# Patient Record
Sex: Male | Born: 1970 | Race: Black or African American | Hispanic: No | Smoking: Never smoker
Health system: Southern US, Community
[De-identification: ages and names within clinical notes are randomized; demographics above are authoritative.]

## PROBLEM LIST (undated history)

## (undated) DIAGNOSIS — M5430 Sciatica, unspecified side: Secondary | ICD-10-CM

## (undated) DIAGNOSIS — M069 Rheumatoid arthritis, unspecified: Secondary | ICD-10-CM

## (undated) HISTORY — PX: APPENDECTOMY: SHX54

---

## 2008-04-18 ENCOUNTER — Encounter (INDEPENDENT_AMBULATORY_CARE_PROVIDER_SITE_OTHER): Payer: Self-pay | Admitting: General Surgery

## 2008-04-18 ENCOUNTER — Inpatient Hospital Stay (HOSPITAL_COMMUNITY): Admission: EM | Admit: 2008-04-18 | Discharge: 2008-04-20 | Payer: Self-pay | Admitting: Family Medicine

## 2009-06-13 ENCOUNTER — Emergency Department (HOSPITAL_COMMUNITY): Admission: EM | Admit: 2009-06-13 | Discharge: 2009-06-13 | Payer: Self-pay | Admitting: Emergency Medicine

## 2009-06-14 ENCOUNTER — Emergency Department (HOSPITAL_COMMUNITY): Admission: EM | Admit: 2009-06-14 | Discharge: 2009-06-14 | Payer: Self-pay | Admitting: Emergency Medicine

## 2011-01-31 NOTE — H&P (Signed)
[name], [name]             ACCOUNT NO.:  [id number]   MEDICAL RECORD NO.:  [id number]          PATIENT TYPE:  EMS   LOCATION:  MAJO                         FACILITY:  [facility]   PHYSICIAN:  Gabrielle Dare. Janee Morn, M.D.DATE OF BIRTH:  1971/07/28   DATE OF ADMISSION:  04/17/2008  DATE OF DISCHARGE:                              HISTORY & PHYSICAL   CHIEF COMPLAINT:  Right lower quadrant and abdominal pain.   HISTORY OF PRESENT ILLNESS:  [name] is a 40 year old African-  American gentleman who was driving from [place] to [place] and he  developed right lower quadrant abdominal pain about 4:30 this morning.  He pulled up the road.  He had significant nausea and vomiting followed  by dry heaves.  The pain continued and he called his parents, they went  and picked him up.  They brought him to [facility].  He was  evaluated there and there was concern for possible appendicitis.  He was  transferred to the [facility] Emergency Department and underwent further  evaluation including CT scan of the abdomen and pelvis, which is  consistent with acute appendicitis with no evidence of perforation or  abscess.  We were asked to evaluate for further treatment.  He continues  to complain of some pain in the right lower quadrant, though it is  better since he received some pain medication recently and that was 1 mg  of Dilaudid.   PAST MEDICAL HISTORY:  Rheumatoid arthritis was recently diagnosed.  He  was prescribed some antiinflammatories, but he has not started taking  them yet.   SOCIAL HISTORY:  He does not smoke.  He does not drink.  He works in a  [employer].   ALLERGIES:  Codeine.  He had severe reaction to that in the past  requiring hospitalization.   PAST SURGICAL HISTORY:  Right wrist surgery from what he describes as a  trigger thumb.   CURRENT MEDICATIONS:  None.   REVIEW OF SYSTEMS:  GI:  As described above.  Otherwise negative.   PHYSICAL EXAMINATION:   VITALS:  Temperature 100.1, pulse 96,  respirations 18, and blood pressure 127/71.  GENERAL:  He is awake and alert.  He is in no distress.  He appears  stated age.  HEENT:  Normocephalic and atraumatic.  No fascial abnormality is noted.  NECK:  Supple with no tenderness.  LYMPH EXAM:  Reveals no supraclavicular, cervical, or periumbilical  lymphadenopathy.  CHEST:  Clear to auscultation.  Respiratory effort is good.  HEART:  Regular, no murmurs heard.  Distal pulses are 2+ with no  peripheral edema.  ABDOMEN:  Tenderness from right lower quadrant.  There is no guarding or  masses felt.  He has hypoactive bowel sounds and no organomegaly is  palpated.  EXTREMITIES:  Warm with no edema.  SKIN:  Dry with no rashes.  NEUROLOGIC:  Intact with no focal deficits.   CT scan of the abdomen and pelvis shows acute appendicitis with no  evidence of perforation.  Sodium 139, potassium 4, chloride 106, CO2 24,  BUN 7, creatinine 1.21, glucose 118,  white blood cell count 14.2, and  hemoglobin 15.7.   IMPRESSION:  Acute appendicitis.   PLAN:  We will give him IV antibiotics and take him to the operating  room for laparoscopic appendectomy.  Procedure, risks, and benefits were  discussed in detail with the patient and his mother and they agreed.  His mother signed this consent as he had received intravenous narcotics.  We will plan to do his appendectomy emergently tonight.      Gabrielle Dare Janee Morn, M.D.  Electronically Signed     BET/MEDQ  D:  04/17/2008  T:  04/18/2008  Job:  [id number]

## 2011-01-31 NOTE — Op Note (Signed)
NAMEANTHEM, FRAZER             ACCOUNT NO.:  [id number]   MEDICAL RECORD NO.:  [id number]          PATIENT TYPE:  INP   LOCATION:  5153                         FACILITY:  [facility]   PHYSICIAN:  Gabrielle Dare. Janee Morn, M.D.DATE OF BIRTH:  [birth year]   DATE OF PROCEDURE:  04/18/2008  DATE OF DISCHARGE:                               OPERATIVE REPORT   PREOPERATIVE DIAGNOSIS:  Acute appendicitis.   POSTOPERATIVE DIAGNOSIS:  Acute appendicitis.   PROCEDURE:  Laparoscopic appendectomy.   SURGEON:  Gabrielle Dare. Janee Morn, MD   ANESTHESIA:  General.   HISTORY OF PRESENT ILLNESS:  Mr. Marrow is a 40 year old African  American gentleman who initially went to [facility] with  abdominal pain, nausea, and vomiting who was referred to [facility]  Emergency Department and further evaluation included CT scan of the  abdomen and pelvis demonstrating acute appendicitis.  He is brought to  the operating room for emergency appendectomy.   PROCEDURE IN DETAIL:  Informed consent was obtained.  The patient was  identified in the preop holding area.  He received intravenous  antibiotics.  He was brought to the operating room.  General anesthesia  was administered.  Foley was inserted by nursing staff.  His abdomen was  prepped and draped in a sterile fashion.  Infraumbilical region was  infiltrated with 0.25% Marcaine.  Infraumbilical incision was made.  Subcutaneous tissues were dissected down revealing the anterior fascia.  This was divided sharply.  Peritoneal cavity was then entered under  direct vision without difficulty.  A 0 Vicryl purse-string suture was  placed around the fascial opening.  The Hasson trocar was inserted into  the abdomen.  The abdomen was insufflated with carbon dioxide in a  standard fashion under direct vision.  A 12-mm left lower quadrant and a  5-mm right upper quadrant ports were placed.  Marcaine 0.25% with  epinephrine was used at all port sites.  Laparoscopic  exploration  revealed acutely inflamed, but not perforated appendix.  The appendix  was retracted anteriorly.  The mesoappendix was then gradually divided  with the harmonic scalpel achieving excellent hemostasis.  The base was  intact without inflammation.  The base of the appendix was then divided  with endoscopic GIA stapler with a vascular load.  This achieved an  excellent closure.  Appendix was placed in an Endocatch bag and removed  from the abdomen via the left lower quadrant port site.  The abdomen was  copiously irrigated with over a liter of warm saline.  Irrigation fluid  returned clear.  The mesoappendix remained completely dry and the staple  line remained intact.  Irrigation fluid was evacuated and it was clear.  Ports were removed under direct vision.  Pneumoperitoneum was released.  The Hasson trocar was removed.  The infraumbilical fascia was closed by  tying the 0 Vicryl purse-string suture with care not to trap any intra-  abdominal  contents.  All 3 wounds were copiously irrigated.  The skin of each was  then closed with running 4-0 Vicryl subcuticular stitch followed by  Dermabond.  Sponge, needle, and instrument counts were  all correct.  The  patient tolerated the procedure well without apparent complication and  was taken to the recovery room in stable condition.      Gabrielle Dare Janee Morn, M.D.  Electronically Signed     BET/MEDQ  D:  04/18/2008  T:  04/18/2008  Job:  [id number]

## 2011-01-31 NOTE — Discharge Summary (Signed)
NAMESANDFORD, DIOP             ACCOUNT NO.:  [id number]   MEDICAL RECORD NO.:  [id number]          PATIENT TYPE:  INP   LOCATION:  5153                         FACILITY:  [facility]   PHYSICIAN:  Wilmon Arms. Corliss Skains, M.D. DATE OF BIRTH:  April 19, 1971   DATE OF ADMISSION:  04/17/2008  DATE OF DISCHARGE:  04/20/2008                               DISCHARGE SUMMARY   DISCHARGING PHYSICIAN:  Wilmon Arms. Corliss Skains, M.D.   CHIEF COMPLAINT/REASON FOR ADMISSION:  Mr. Shipley is a 40 year old  male patient who was relocating to [place], [place], from [place], [place].  He was actually driving down here when he developed  right lower quadrant abdominal pain with nausea and vomiting.  His  parents brought him to the [facility] and he was  subsequently transferred to [facility] Emergency Room after a CT of the  abdomen and pelvis revealed acute appendicitis.   On initial evaluation, the patient had a low grade temperature of 100.1,  otherwise vital signs were stable.  On exam, he was tender in the right  lower quadrant without guarding or masses and hypoactive bowel sounds.  His white count was 14,200, hemoglobin was 15.7, lipase 14.  CT was  reviewed per Dr. Janee Morn and based on clinical findings and CT, it was  determined that the patient required hospital admission and urgent  operative intervention.   ADMITTING DIAGNOSIS:  Acute appendicitis.   HOSPITAL COURSE:  The patient was admitted taken directly from the ER to  the OR in the early morning hours of April 18, 2008, where he underwent  a laparoscopic appendectomy for an acute nonperforated appendicitis by  Dr. Violeta Gelinas.  He was subsequently sent back to the general floor  to recover.   On postop day #1, the patient was better tolerating p.o. diet but did  have a temperature up to 102.  His abdomen was soft.  Incisions were  clean and dry.  Bowel sounds were present.  He was continued briefly on  Avelox for  the next 24 hours and CBC was checked the following morning.   On postop day #2, the patient was afebrile, vital signs were stable.  His white count had decreased to 8000, hemoglobin was 13.4.  He was  ambulating, tolerated diet and oral pain medicines without nausea or  vomiting.  His abdomen was soft, mildly tender over the incision, but  otherwise unremarkable.  The patient was otherwise deemed appropriate  for discharge home.   FINAL DISCHARGE DIAGNOSES:  1. Acute appendicitis.  2. Status post laparoscopic appendectomy on April 18, 2008, by Dr.      Violeta Gelinas, nonperforated appendix.   DISCHARGE MEDICATIONS:  The patient was not taking any medications prior  to admission.  These are his new medications.  1. Percocet 5/325 one or two tablets every 4 hours as need for pain.  2. Over-the-counter ibuprofen 2-3 tablets every 8 hours as needed for      pain.  Always take with food.  May take in addition to Percocet.   ACTIVITY:  The patient is to be out  of work for 2 weeks.  No lifting  greater than 10 pounds.  Otherwise it is okay to shower and slowly  resume usual activities.   DIET:  No changes in preadmission diet.   WOUND CARE:  The patient has Dermabond.  Therefore, no wound care  indicated.   ADDITIONAL INSTRUCTIONS:  The patient is to call the doctor if he  develops redness, drainage or swelling in his puncture wounds,  temperature greater than 101 degrees Fahrenheit, severe pain not  relieved by nonaspirin products or prescribed pain medications.   FOLLOW UP:  The patient will eventually be returning back to [place]  before moving back to [place].  In the interim, he plans on  staying in [place] for a little over a week, so he has been  instructed to call Dr. Janee Morn to be seen in 7-10 days.  He will also  be given a copy of this priority discharge summary to take with him back  to his physicians in [place] and to have available if he develops  any  issues while traveling.      Allison L. Rondel Jumbo. Tsuei, M.D.  Electronically Signed    ALE/MEDQ  D:  04/20/2008  T:  04/20/2008  Job:  [id number]   cc:   Gabrielle Dare. Janee Morn, M.D.

## 2011-04-14 ENCOUNTER — Emergency Department (HOSPITAL_COMMUNITY)
Admission: EM | Admit: 2011-04-14 | Discharge: 2011-04-15 | Disposition: A | Discharge status: Home / Self Care | Payer: Self-pay | Attending: Emergency Medicine | Admitting: Emergency Medicine

## 2011-04-14 DIAGNOSIS — K089 Disorder of teeth and supporting structures, unspecified: Secondary | ICD-10-CM | POA: Insufficient documentation

## 2011-04-14 DIAGNOSIS — K047 Periapical abscess without sinus: Secondary | ICD-10-CM | POA: Insufficient documentation

## 2011-04-16 ENCOUNTER — Emergency Department (HOSPITAL_COMMUNITY): Payer: Medicaid Other

## 2011-04-16 ENCOUNTER — Observation Stay (HOSPITAL_COMMUNITY)
Admission: EM | Admit: 2011-04-16 | Discharge: 2011-04-17 | DRG: 918 | Disposition: A | Discharge status: Home / Self Care | Payer: Medicaid Other | Attending: Family Medicine | Admitting: Family Medicine

## 2011-04-16 DIAGNOSIS — T391X1A Poisoning by 4-Aminophenol derivatives, accidental (unintentional), initial encounter: Principal | ICD-10-CM | POA: Insufficient documentation

## 2011-04-16 DIAGNOSIS — Y92009 Unspecified place in unspecified non-institutional (private) residence as the place of occurrence of the external cause: Secondary | ICD-10-CM | POA: Insufficient documentation

## 2011-04-16 DIAGNOSIS — Z23 Encounter for immunization: Secondary | ICD-10-CM | POA: Insufficient documentation

## 2011-04-16 DIAGNOSIS — K029 Dental caries, unspecified: Secondary | ICD-10-CM | POA: Insufficient documentation

## 2011-04-16 DIAGNOSIS — M069 Rheumatoid arthritis, unspecified: Secondary | ICD-10-CM | POA: Insufficient documentation

## 2011-04-16 DIAGNOSIS — D72829 Elevated white blood cell count, unspecified: Secondary | ICD-10-CM | POA: Insufficient documentation

## 2011-04-16 LAB — CBC
HCT: 46.1 % (ref 39.0–52.0)
Hemoglobin: 16.1 g/dL (ref 13.0–17.0)
MCHC: 34.9 g/dL (ref 30.0–36.0)
WBC: 12.4 10*3/uL — ABNORMAL HIGH (ref 4.0–10.5)

## 2011-04-16 LAB — DIFFERENTIAL
Basophils Absolute: 0 10*3/uL (ref 0.0–0.1)
Lymphocytes Relative: 18 % (ref 12–46)
Monocytes Absolute: 0.9 10*3/uL (ref 0.1–1.0)
Neutro Abs: 9.2 10*3/uL — ABNORMAL HIGH (ref 1.7–7.7)

## 2011-04-16 LAB — COMPREHENSIVE METABOLIC PANEL
ALT: 61 U/L — ABNORMAL HIGH (ref 0–53)
AST: 26 U/L (ref 0–37)
Albumin: 4.4 g/dL (ref 3.5–5.2)
Alkaline Phosphatase: 53 U/L (ref 39–117)
Potassium: 4.4 mEq/L (ref 3.5–5.1)
Sodium: 136 mEq/L (ref 135–145)
Total Protein: 8.2 g/dL (ref 6.0–8.3)

## 2011-04-16 LAB — ACETAMINOPHEN LEVEL: Acetaminophen (Tylenol), Serum: <15 ug/mL (ref 10–30)

## 2011-04-16 LAB — PROTIME-INR: INR: 0.9 (ref 0.00–1.49)

## 2011-04-16 LAB — APTT: aPTT: 28 seconds (ref 24–37)

## 2011-04-17 ENCOUNTER — Inpatient Hospital Stay (HOSPITAL_COMMUNITY): Payer: Medicaid Other

## 2011-04-17 LAB — DRUGS OF ABUSE SCREEN W/O ALC, ROUTINE URINE
Cocaine Metabolites: NEGATIVE
Methadone: NEGATIVE
Opiate Screen, Urine: POSITIVE — AB
Phencyclidine (PCP): NEGATIVE

## 2011-04-17 LAB — COMPREHENSIVE METABOLIC PANEL
ALT: 46 U/L (ref 0–53)
Albumin: 3.6 g/dL (ref 3.5–5.2)
Alkaline Phosphatase: 43 U/L (ref 39–117)
BUN: 12 mg/dL (ref 6–23)
Chloride: 102 mEq/L (ref 96–112)
GFR calc Af Amer: >60 mL/min (ref >60)
Glucose, Bld: 125 mg/dL — ABNORMAL HIGH (ref 70–99)
Potassium: 3.9 mEq/L (ref 3.5–5.1)
Sodium: 141 mEq/L (ref 135–145)
Total Bilirubin: 0.4 mg/dL (ref 0.3–1.2)

## 2011-04-17 LAB — CBC
Hemoglobin: 14.5 g/dL (ref 13.0–17.0)
MCH: 29.7 pg (ref 26.0–34.0)
MCV: 87.1 fL (ref 78.0–100.0)
RBC: 4.89 MIL/uL (ref 4.22–5.81)

## 2011-04-17 LAB — ACETAMINOPHEN LEVEL: Acetaminophen (Tylenol), Serum: <15 ug/mL (ref 10–30)

## 2011-04-17 NOTE — Discharge Summary (Signed)
NAMEKIRKLIN, MCDUFFEE             ACCOUNT NO.:  [id number]  MEDICAL RECORD NO.:  [id number]  LOCATION:  3012                         FACILITY:  [facility]  PHYSICIAN:  Pleas Koch, MD        DATE OF BIRTH:  1971/02/17  DATE OF ADMISSION:  04/16/2011 DATE OF DISCHARGE:                              DISCHARGE SUMMARY   DISCHARGE DIAGNOSES: 1. Inadvertent Tylenol toxicity secondary to overdose of Tylenol. 2. Dental caries, possible dental abscess. 3. Leukocytosis secondary to inadvertent Tylenol toxicity.  Possible hypertension.  DISCHARGE MEDICATIONS: 1. Discontinue Tylenol products completely. 2. Amoxicillin 500 mg 1 tablet t.i.d. 21 tablets to complete 7 more     days. 3. Benzocaine oral gel 1 teaspoon q.2 h p.r.n. 4. Roxicodone 5 mg 1 tab q.6 p.r.n. limited prescription x7 days 20     tablets. 5. Discontinue aspirin.  [name] is a pleasant 40 year old male came to the ED with concerns of Tylenol toxicity.  The patient states that he had tooth pain since last Wednesday.  Took 9 Tylenol 500 tablets and then 8 the following day, present to the ED for evaluation, got Novocain shot and was started on 650 Tylenol and amoxicillin, then developed some nausea, vomiting, and abdominal pain.  This was relieved by GI cocktail.  He consulted Poison Control and was given the option to come, expressed that and presented, and has not had any further issues.  He has no other real history other than possible rheumatoid arthritis and he may be prehypertensive.  His current medications are Tylenol and amoxicillin.  Amoxicillin what he started on Wednesday last.  Vitals on admission: Temperature 98.4, blood pressure 127/88, pulse of 108, respirations 20.  Tooth, he has an abscess in the right upper molar and poor dentition overall.  WBC of 12.4 on admission, hemoglobin/hematocrit are 16.1 And 46.1, INR was less than 0.9, Tylenol was less than 15, ALT was slightly elevated at 61, AST was  26.  Rest of LFTs were normal.  Salicylate level less than 0.2.  ASSESSMENT:  [name] was admitted as an outpatient [facility] to [facility].  His repeat Tylenol level is less than 15.  His ALT, AST are completely normal and his vitals are stable.  He has no further abdominal pain.  He has no further discomfort and actually he feels pretty good.  I did call Poision Control Center and they recommended that the patient does not need further workup or care and can safely from this standpoint can be discharged home.  I have discussed the plan of care with the patient, he feels comfortable doing the same.  I discontinued his IV Unasyn which he has been placed on overnight as well as IV fluids.  I will let him have full diet.  If the patient feels comfortable and does not have any abdominal pain, we will discharge home later  this evening.  I will attempt to have him set up an appointment with someone in the near future and will get clinical social work to give him the list of PCPs.  I spoke with Poison control subsequnet to this dictation and per their protcol patient is  safe for discharge,  and pt requires no further labs at present        ______________________________ Pleas Koch, MD     JS/MEDQ  D:  04/17/2011  T:  04/17/2011  Job:  [id number]  cc:   Primary Care Physician  Electronically Signed by Pleas Koch MD on 04/17/2011 10:11:56 PM

## 2011-04-20 LAB — OPIATE, QUANTITATIVE, URINE
Morphine, Confirm: NEGATIVE NG/ML
Oxycodone, ur: 1017 NG/ML — ABNORMAL HIGH
Oxymorphone: NEGATIVE NG/ML

## 2011-04-24 LAB — BARBITURATE, URINE, CONFIRMATION
Butalbital UR Quant: NEGATIVE
Pentobarbital GC/MS Conf: NEGATIVE
Phenobarbital GC/MS Conf: 260 ng/mL
Secobarbital GC/MS Conf: NEGATIVE

## 2011-05-09 NOTE — H&P (Signed)
[name], [name]             ACCOUNT NO.:  [id number]  MEDICAL RECORD NO.:  [id number]  LOCATION:  [facility]                         FACILITY:  [facility]  PHYSICIAN:  Richarda Overlie, MD       DATE OF BIRTH:  29-May-1971  DATE OF ADMISSION:  04/16/2011 DATE OF DISCHARGE:                             HISTORY & PHYSICAL   PRIMARY CARE PHYSICIAN:  Unassigned.  CHIEF COMPLAINT:  Tylenol toxicity.  SUBJECTIVE:  This is a 40 year old male who presents to the ER with a chief complaint of concerns about Tylenol toxicity who states that he has had a cavity in his left molar with pain for the last several days, on Wednesday, he took nine 500 mg tablets of Tylenol; on Thursday, he took another 8 tablets of Tylenol.  He presented to the ED on Friday and gotten received a Novocaine short and was started on amoxicillin 500 mg p.o. t.i.d. and was prescribed 650 mg q.6 h. of Tylenol.  He called the [facility] because he started developing diffuse abdominal pain and mild nausea and poison control notified him that there maybe a concern about Tylenol toxicity and referred him to the ER.  He denies any fever, chills or rigors at home.  He denies any joint pain and neck stiffness.  He denies any other cardiopulmonary symptoms.  PAST MEDICAL HISTORY:  History of rheumatoid arthritis.  Currently, not on any treatment.  SOCIAL HISTORY:  Denies any history of smoking, alcohol use, currently works in a warehouse.  FAMILY HISTORY:  Negative for rheumatoid arthritis and positive for hypertension in the mother.  ALLERGIES:  To CODEINE.  CURRENT MEDICATIONS:  Tylenol and amoxicillin.  REVIEW OF SYSTEMS:  Complete review of systems was done as documented in the HPI.  SURGICAL HISTORY:  Right wrist surgery due to trigger finger.  PHYSICAL EXAMINATION:  VITAL SIGNS:  Temperature 99.4, blood pressure 127/88, pulse 108 and respirations 20. GENERAL:  Comfortable, in no acute cardiopulmonary  distress. HEENT:  Pupils equal and reactive.  Extraocular movements intact. NECK:  Supple.  No JVD. LUNGS:  Clear to auscultation bilaterally.  No wheezes, no crackles or rhonchi. CARDIOVASCULAR:  Regular rate and rhythm.  No murmurs, rubs or gallops. ABDOMEN:  Soft, nontender and nondistended.  No rebound and no guarding. No right upper quadrant tenderness. NEUROLOGIC:  Cranial nerves II through XII grossly intact. PSYCHIATRIC:  Appropriate mood and affect.  LABORATORY DATA:  CBC; WBC 12.4, hemoglobin 16.1, hematocrit 46.1 and platelet count of 319.  INR of 0.9, Tylenol level less than 15.  CMP sodium 136, potassium 4.4, chloride 97, bicarb 28, glucose 108, BUN 11, creatinine 1.28, total bilirubin 0.3, AST 26, ALT 61, calcium 10.3 and lipase 17.  Salicylate level of less than 2.  ASSESSMENT: 1. Dental caries possible dental abscess. 2. Possible Tylenol toxicity. 3. Leukocytosis likely secondary to dental abscess.  PLAN:  The patient will be admitted to a regular medical bed overnight. We will continue treatment for his dental abscess with Unasyn.  We will workup his fever with urinalysis and a chest x-ray.  We will obtain blood cultures.  Most likely, the patient will need 24-hour observation. We will administer Mucomyst per  Poison Control guidelines with a loading dose of 140 mg/kg and maintenance does every 4 hours up to a total of 6 doses of NAC.  We will repeat his liver function tests as PT/INR in the morning.  If his symptoms resolve and his AST, ALT and INR are within normal limits, then the patient maybe discharged in the morning.  He is a full code.     Richarda Overlie, MD     NA/MEDQ  D:  04/16/2011  T:  04/16/2011  Job:  [id number]  Electronically Signed by Richarda Overlie MD on 05/09/2011 10:30:15 PM

## 2011-06-12 ENCOUNTER — Emergency Department (HOSPITAL_COMMUNITY)
Admission: EM | Admit: 2011-06-12 | Discharge: 2011-06-12 | Disposition: A | Discharge status: Home / Self Care | Payer: Self-pay | Attending: Emergency Medicine | Admitting: Emergency Medicine

## 2011-06-12 DIAGNOSIS — K029 Dental caries, unspecified: Secondary | ICD-10-CM | POA: Insufficient documentation

## 2011-06-12 DIAGNOSIS — K047 Periapical abscess without sinus: Secondary | ICD-10-CM | POA: Insufficient documentation

## 2011-06-12 DIAGNOSIS — K089 Disorder of teeth and supporting structures, unspecified: Secondary | ICD-10-CM | POA: Insufficient documentation

## 2011-06-12 DIAGNOSIS — R220 Localized swelling, mass and lump, head: Secondary | ICD-10-CM | POA: Insufficient documentation

## 2011-06-16 LAB — URINALYSIS, ROUTINE W REFLEX MICROSCOPIC
Glucose, UA: NEGATIVE
Hgb urine dipstick: NEGATIVE
Leukocytes, UA: NEGATIVE
Protein, ur: 30 — AB
Specific Gravity, Urine: 1.035 — ABNORMAL HIGH
Urobilinogen, UA: 1

## 2011-06-16 LAB — DIFFERENTIAL
Basophils Relative: 0
Lymphocytes Relative: 3 — ABNORMAL LOW
Monocytes Absolute: 0.2
Monocytes Relative: 1 — ABNORMAL LOW
Neutro Abs: 13.6 — ABNORMAL HIGH
Neutrophils Relative %: 96 — ABNORMAL HIGH

## 2011-06-16 LAB — CBC
HCT: 47.6
Hemoglobin: 15.7
Hemoglobin: 13.4
MCHC: 33
MCHC: 33.2
MCV: 90.5
MCV: 90.2
Platelets: 252
RBC: 4.48
RDW: 13
WBC: 8

## 2011-06-16 LAB — COMPREHENSIVE METABOLIC PANEL
Albumin: 4.3
BUN: 7
Calcium: 9.6
Creatinine, Ser: 1.21
Glucose, Bld: 118 — ABNORMAL HIGH
Total Protein: 8.1

## 2013-04-11 IMAGING — CR DG ABDOMEN ACUTE W/ 1V CHEST
3 series · 3 of 3 positions shown · non-contrast
Comparison: None.

CLINICAL DATA: Abdominal pain.  Nausea.

ACUTE ABDOMEN SERIES (ABDOMEN 2 VIEW & CHEST 1 VIEW)

[w chest pa]
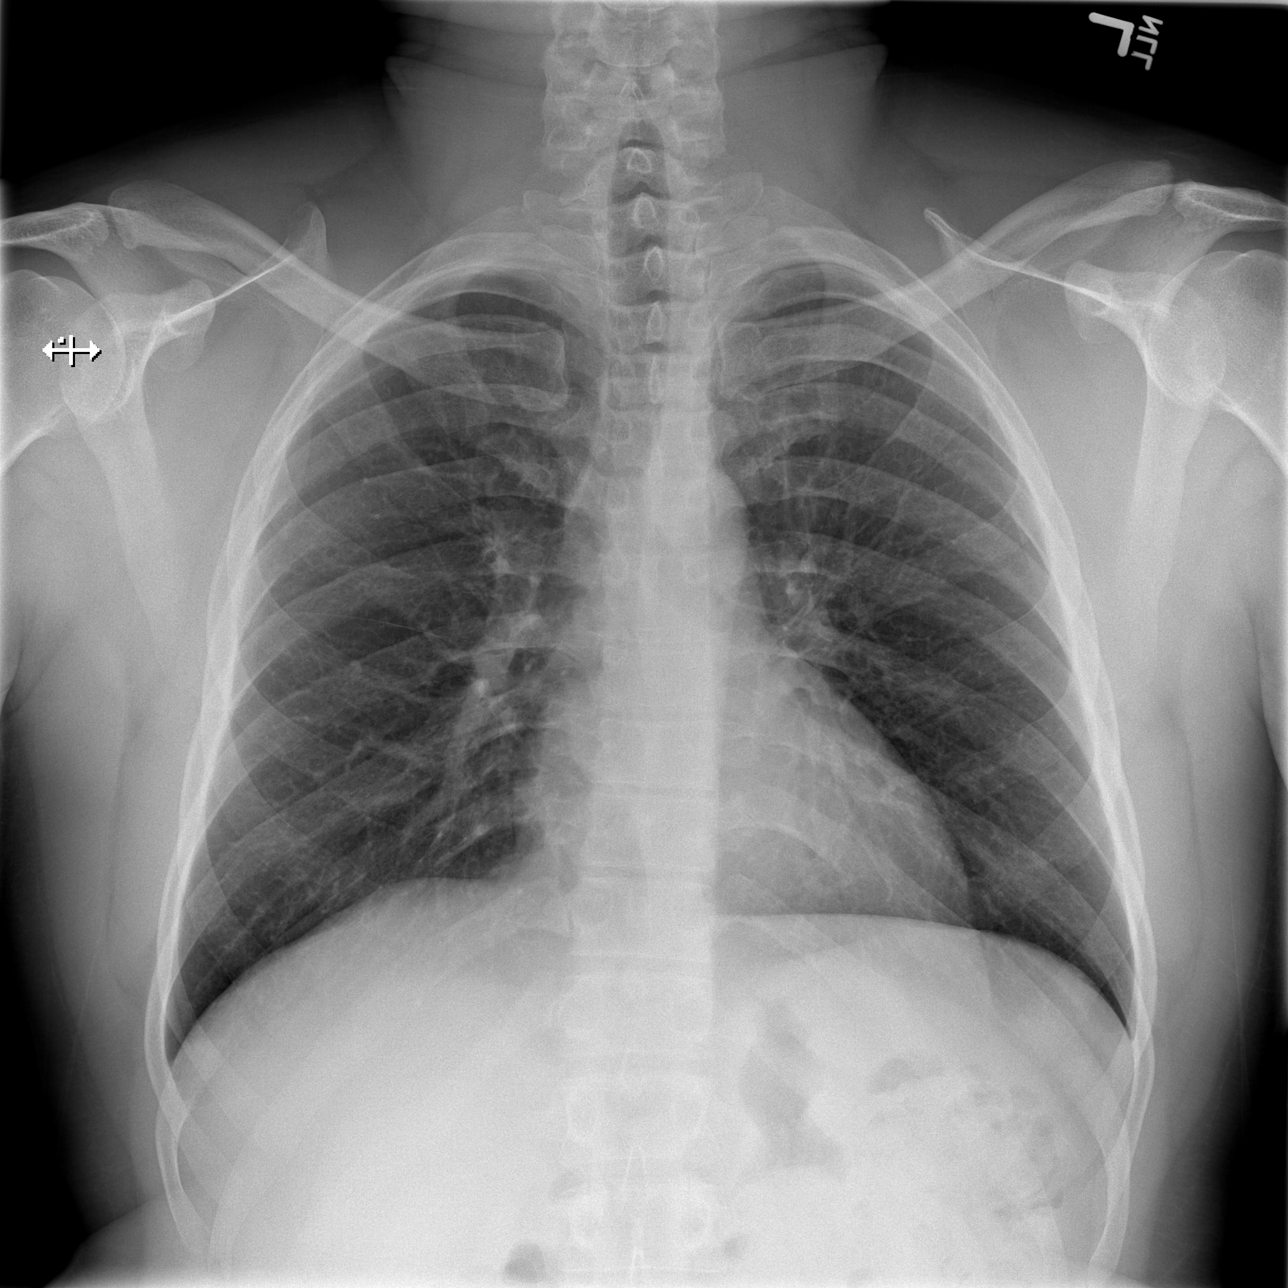

[w abdomen upright]
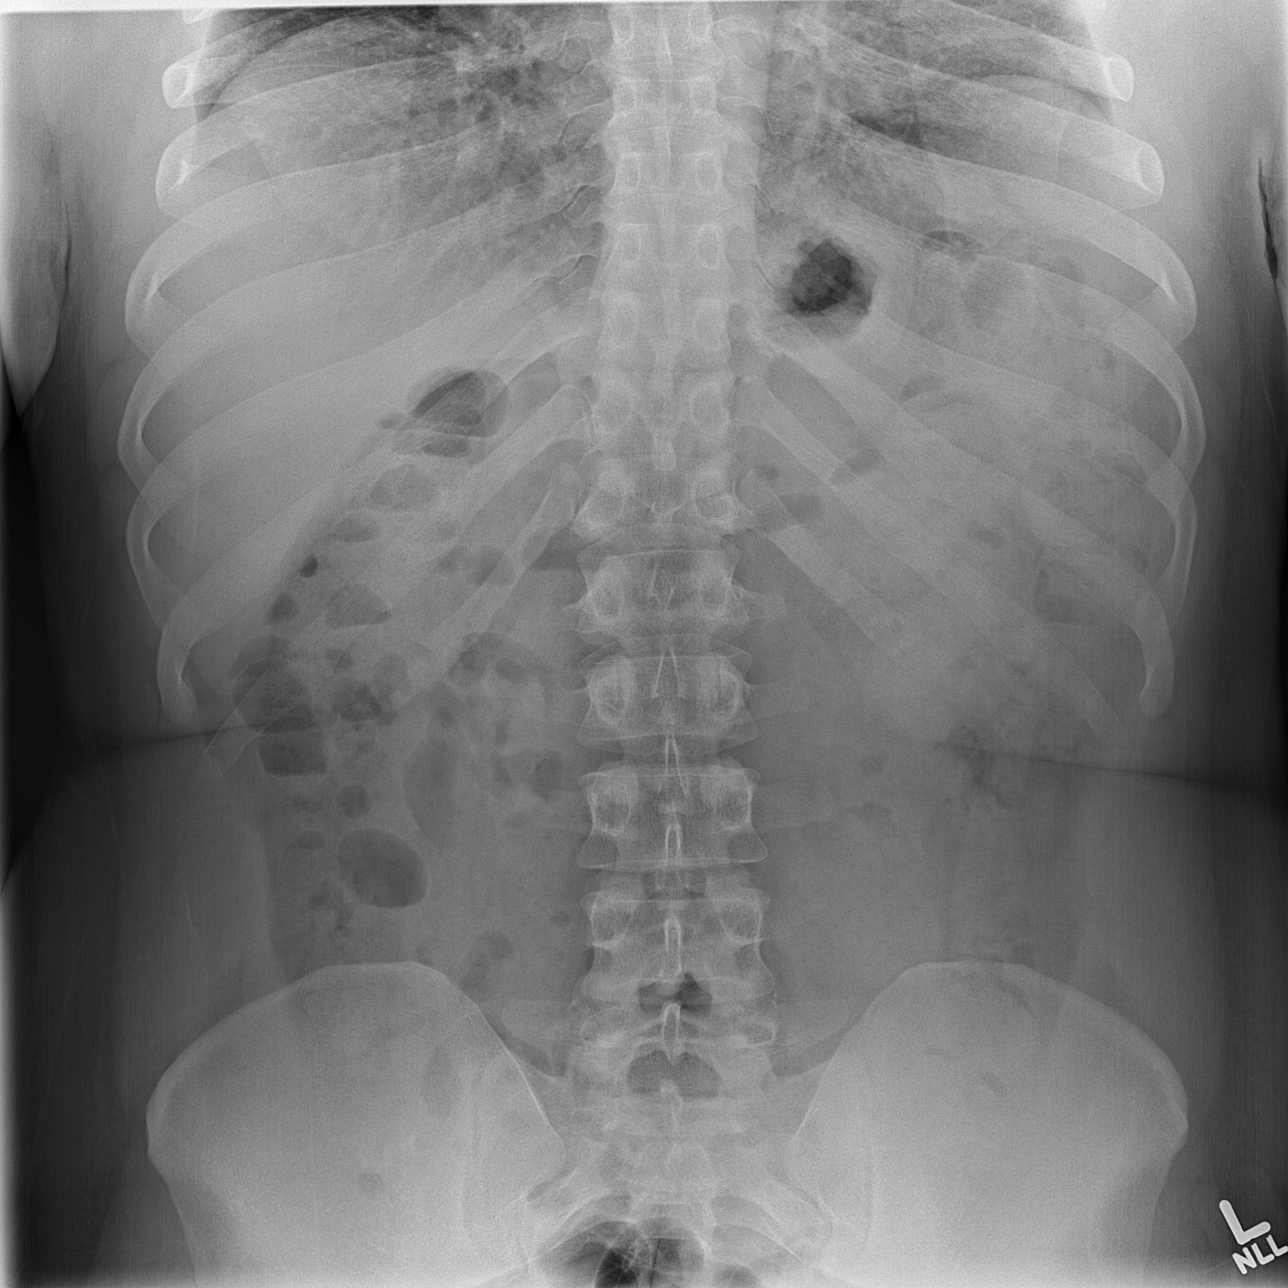

[t abdomen supine]
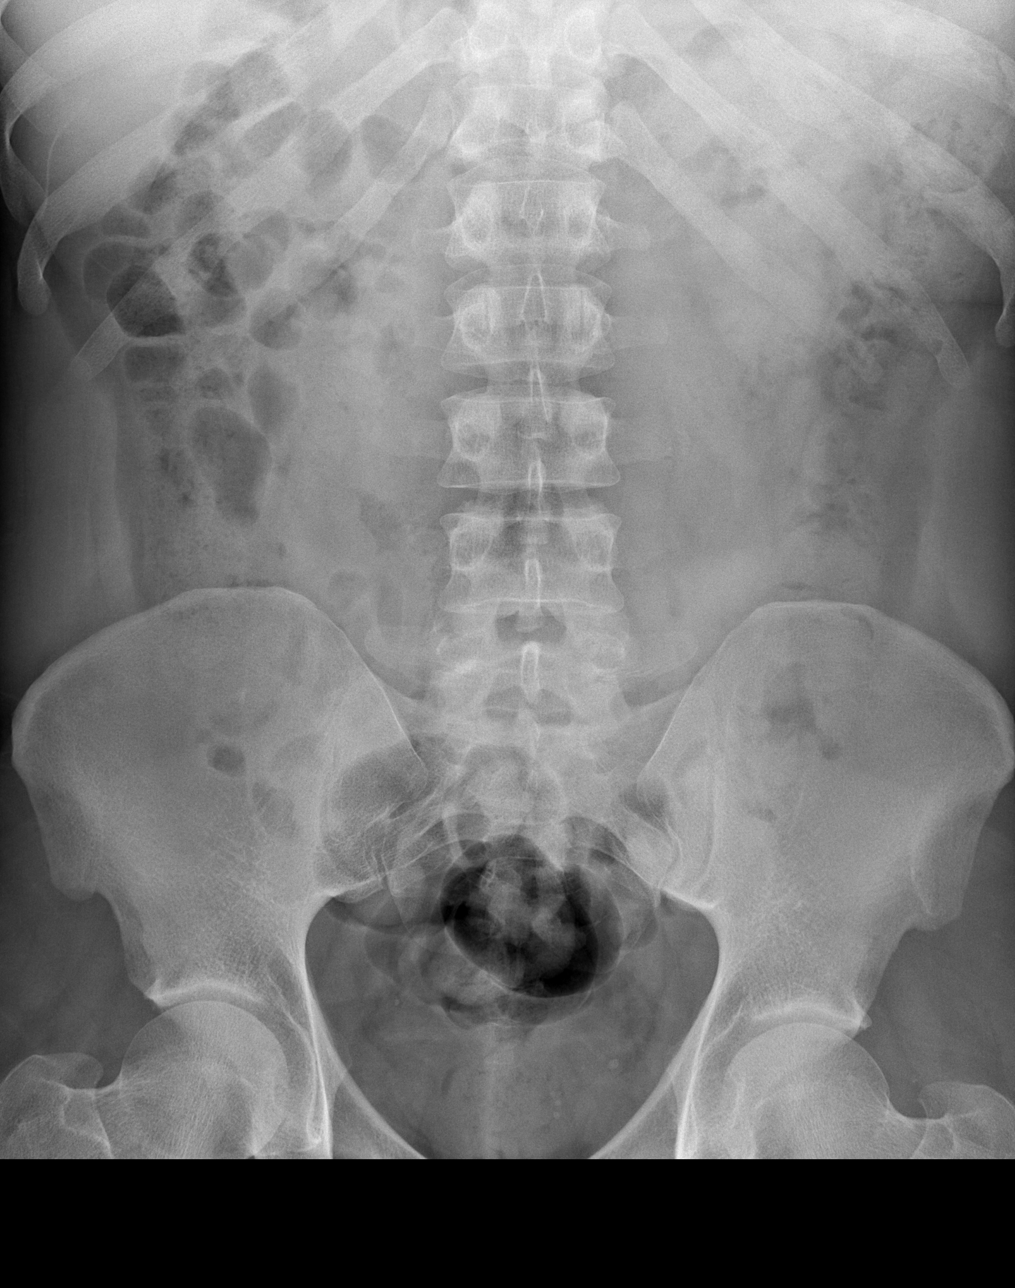

[3 of 3 positions shown; findings below may reference images not displayed]

FINDINGS: No evidence of dilated bowel loops.  Scattered air fluid
levels are seen throughout the colon, which is abnormal but
nonspecific finding.  No evidence of free air.  No radiopaque
calculi or abnormal mass effect identified.

Heart size and mediastinal contours are normal.  Both lungs are
clear.
IMPRESSION: 1.  Nonspecific, nonobstructive bowel gas pattern.
2.  No active cardiopulmonary disease.

## 2013-04-12 IMAGING — CR DG CHEST 2V
2 series · 2 of 2 positions shown · non-contrast
Comparison: None.

CLINICAL DATA: Tylenol toxicity and to effect.  Rule out pneumonia.

CHEST - 2 VIEW

[w chest pa]
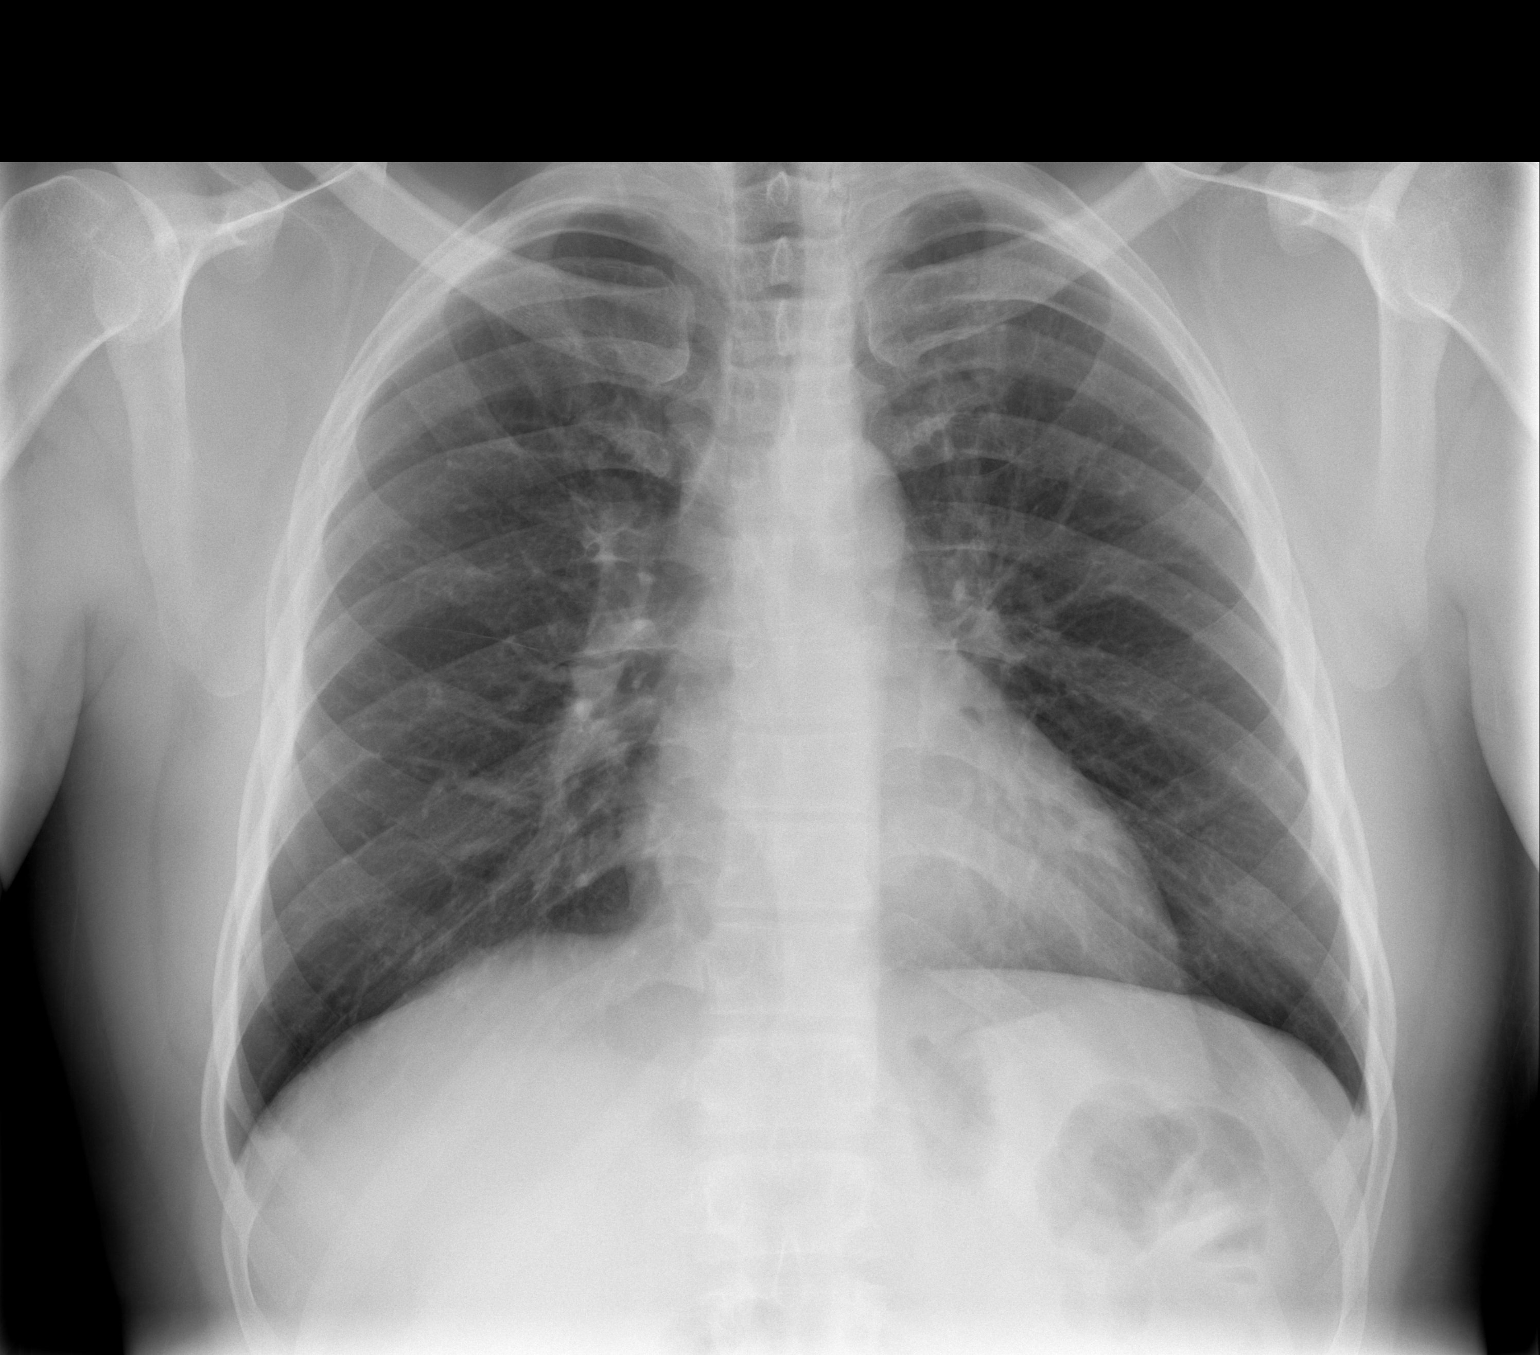

[w chest lat]
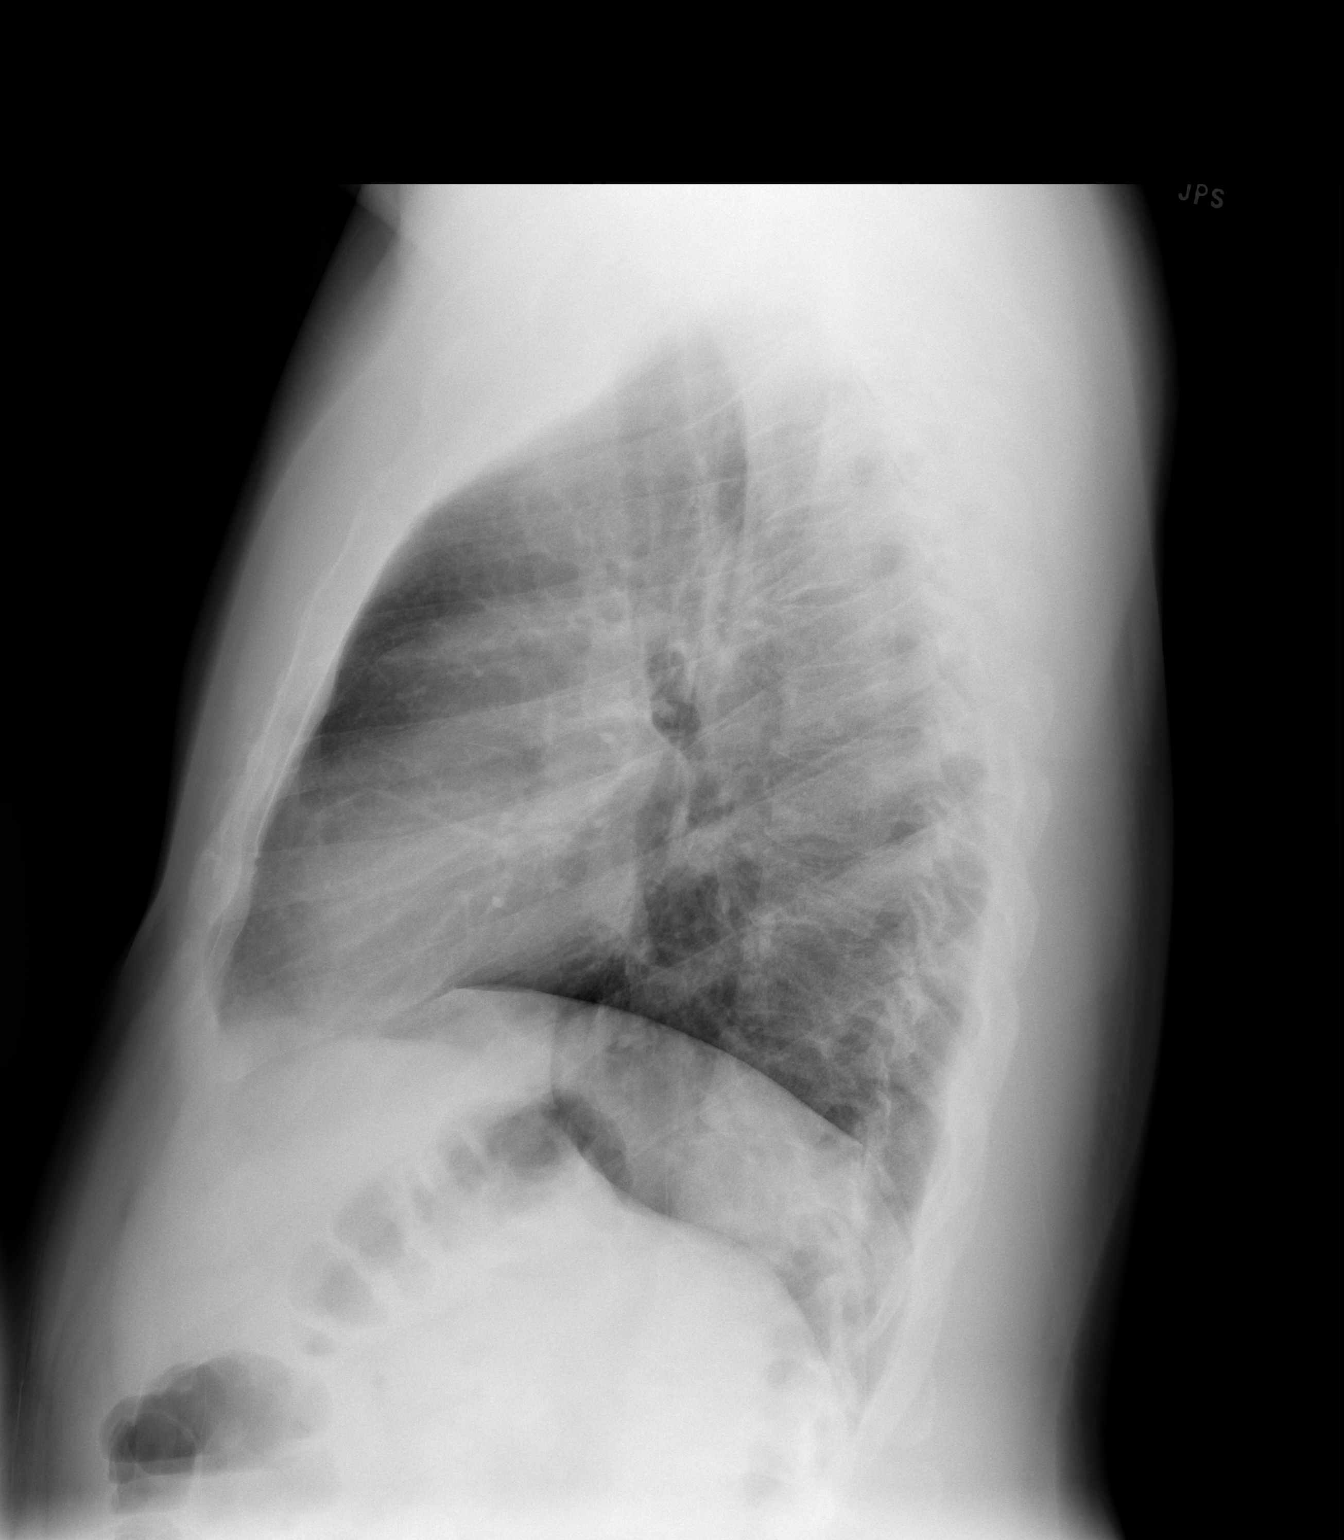

[2 of 2 positions shown; findings below may reference images not displayed]

FINDINGS: The heart size and pulmonary vascularity are normal. The
lungs appear clear and expanded without focal air space disease or
consolidation. No blunting of the costophrenic angles.  Linear
structure along the right side of the neck might represent a venous
catheter.  No pneumothorax.
IMPRESSION: No evidence of active pulmonary disease.

## 2013-10-21 ENCOUNTER — Emergency Department (HOSPITAL_COMMUNITY)
Admission: EM | Admit: 2013-10-21 | Discharge: 2013-10-21 | Disposition: A | Discharge status: Home / Self Care | Payer: Medicaid Other | Attending: Emergency Medicine | Admitting: Emergency Medicine

## 2013-10-21 ENCOUNTER — Encounter (HOSPITAL_COMMUNITY): Payer: Self-pay | Admitting: Emergency Medicine

## 2013-10-21 DIAGNOSIS — Y990 Civilian activity done for income or pay: Secondary | ICD-10-CM | POA: Insufficient documentation

## 2013-10-21 DIAGNOSIS — M5431 Sciatica, right side: Secondary | ICD-10-CM

## 2013-10-21 DIAGNOSIS — R300 Dysuria: Secondary | ICD-10-CM | POA: Insufficient documentation

## 2013-10-21 DIAGNOSIS — Y9389 Activity, other specified: Secondary | ICD-10-CM | POA: Insufficient documentation

## 2013-10-21 DIAGNOSIS — X500XXA Overexertion from strenuous movement or load, initial encounter: Secondary | ICD-10-CM | POA: Insufficient documentation

## 2013-10-21 DIAGNOSIS — M5430 Sciatica, unspecified side: Secondary | ICD-10-CM | POA: Insufficient documentation

## 2013-10-21 DIAGNOSIS — IMO0002 Reserved for concepts with insufficient information to code with codable children: Secondary | ICD-10-CM | POA: Insufficient documentation

## 2013-10-21 DIAGNOSIS — Y9289 Other specified places as the place of occurrence of the external cause: Secondary | ICD-10-CM | POA: Insufficient documentation

## 2013-10-21 LAB — URINALYSIS, ROUTINE W REFLEX MICROSCOPIC
BILIRUBIN URINE: NEGATIVE
Glucose, UA: NEGATIVE mg/dL
Hgb urine dipstick: NEGATIVE
Ketones, ur: NEGATIVE mg/dL
Leukocytes, UA: NEGATIVE
NITRITE: NEGATIVE
Protein, ur: NEGATIVE mg/dL
SPECIFIC GRAVITY, URINE: 1.02 (ref 1.005–1.030)
UROBILINOGEN UA: 0.2 mg/dL (ref 0.0–1.0)
pH: 6.5 (ref 5.0–8.0)

## 2013-10-21 MED ORDER — PREDNISONE 20 MG PO TABS: ORAL_TABLET | ORAL | Status: DC

## 2013-10-21 MED ORDER — OXYCODONE-ACETAMINOPHEN 5-325 MG PO TABS: 2.0000 | ORAL_TABLET | ORAL | Status: DC | PRN

## 2013-10-21 NOTE — ED Notes (Signed)
MD at bedside. 

## 2013-10-21 NOTE — Discharge Instructions (Signed)

## 2013-10-21 NOTE — ED Notes (Addendum)
Presents with dysuria and lower back pain. Pain began after lifting at heavy item at work in the lower back, last night had one episode or burning with urination. Pain is worse in lower back when coughing and when lifting legs up pain is worse when lifting right leg up. Denies hematuria,  Denies fevers, denies abdominal pain. Reports frequent urination and feeling of not emptiyng bladder fully.  When using belt for lifting at work the pain is better.  Pain is constant.

## 2013-10-21 NOTE — ED Provider Notes (Signed)
Medical screening examination/treatment/procedure(s) were performed by non-physician practitioner and as supervising physician I was immediately available for consultation/collaboration.  Megan E Docherty, MD 10/21/13 2054 

## 2013-10-21 NOTE — ED Notes (Signed)
E signature pad not working.  Pt verbalized understanding of d/c instructions.  

## 2013-10-21 NOTE — ED Provider Notes (Signed)
CSN: [id number]     Arrival date & time 10/21/13  1910 History  This chart was scribed for non-physician practitioner, Majel HomerBowie Americo Vallery-PA, working with Shanna CiscoMegan E Docherty, MD by Smiley HousemanFallon Davis, ED Scribe. This patient was seen in room TR09C/TR09C and the patient's care was started at 7:43 PM.   Chief Complaint  Patient presents with  . Back Pain   The history is provided by the patient. No language interpreter was used.   HPI Comments: [name] is a 43 y.o. male who presents to the Emergency Department complaining of constant worsening lower back  pain that started about 9 days ago.  Pt states that he was lifting something at work and he felt a pop in his back.  He denies severe pain at the time of the incident, but as the week went on the pain started to become worse.  Pt states that he wore a back belt and the pain subsided, but now the pain has increased.  Pt states that dysuria started today, but denies penile discharge.  Pt states that pain is increased with leg movement and coughing.  Pt states that the pain radiates down his right leg.    History reviewed. No pertinent past medical history. Past Surgical History  Procedure Laterality Date  . Appendectomy     History reviewed. No pertinent family history. History  Substance Use Topics  . Smoking status: Never Smoker   . Smokeless tobacco: Not on file  . Alcohol Use: No    Review of Systems  Genitourinary: Positive for dysuria. Negative for discharge.  Musculoskeletal: Positive for back pain.    Allergies  Bee venom and Codeine  Home Medications  No current outpatient prescriptions on file. Triage Vitals: BP 127/73  Pulse 74  Temp(Src) 98.5 F (36.9 C) (Oral)  Resp 20  Wt 240 lb 5 oz (109.005 kg)  SpO2 98% Physical Exam  Nursing note and vitals reviewed. Constitutional: He is oriented to person, place, and time. He appears well-developed and well-nourished. No distress.  HENT:  Head: Normocephalic and atraumatic.   Eyes: Conjunctivae and EOM are normal. Right eye exhibits no discharge. Left eye exhibits no discharge.  Neck: Neck supple. No tracheal deviation present.  Cardiovascular: Normal rate.   Pulmonary/Chest: Effort normal. No respiratory distress.  Abdominal: There is no CVA tenderness.  Musculoskeletal: Normal range of motion.  Low back pain with flexion.  Tenderness to lumbar and paralumbar regions favoring right side more than left.  Positive straight leg raise.  No foot drop.  Normal gait.   Neurological: He is alert and oriented to person, place, and time. Gait normal.  Reflex Scores:      Patellar reflexes are 2+ on the right side and 2+ on the left side. Skin: Skin is warm and dry. No rash noted.  Psychiatric: He has a normal mood and affect. His behavior is normal. Judgment and thought content normal.    ED Course  Procedures (including critical care time) DIAGNOSTIC STUDIES: Oxygen Saturation is 98% on RA, normal by my interpretation.    COORDINATION OF CARE: 7:54 PM-Explained to pt that is sounds like sciatica, but will check UA due to complaint of dysuria.  Patient informed of current plan of treatment and evaluation and agrees with plan.    Labs Review Labs Reviewed  URINALYSIS, ROUTINE W REFLEX MICROSCOPIC   Imaging Review No results found.  EKG Interpretation   None       MDM   1. Sciatica of  right side    BP 127/73  Pulse 74  Temp(Src) 98.5 F (36.9 C) (Oral)  Resp 20  Wt 240 lb 5 oz (109.005 kg)  SpO2 98%   I have reviewed nursing notes and vital signs. I reviewed available ER/hospitalization records thought the EMR    Fayrene Helper, [place] 10/21/13 2015

## 2014-02-11 ENCOUNTER — Emergency Department (HOSPITAL_COMMUNITY)
Admission: EM | Admit: 2014-02-11 | Discharge: 2014-02-11 | Disposition: A | Discharge status: Home / Self Care | Payer: Medicaid Other | Attending: Emergency Medicine | Admitting: Emergency Medicine

## 2014-02-11 ENCOUNTER — Encounter (HOSPITAL_COMMUNITY): Payer: Self-pay | Admitting: Emergency Medicine

## 2014-02-11 DIAGNOSIS — M5416 Radiculopathy, lumbar region: Secondary | ICD-10-CM

## 2014-02-11 DIAGNOSIS — IMO0002 Reserved for concepts with insufficient information to code with codable children: Secondary | ICD-10-CM | POA: Insufficient documentation

## 2014-02-11 HISTORY — DX: Sciatica, unspecified side: M54.30

## 2014-02-11 MED ORDER — KETOROLAC TROMETHAMINE 60 MG/2ML IM SOLN
30.0000 mg | Freq: Once | INTRAMUSCULAR | Status: AC
  Administered 2014-02-11: 30 mg via INTRAMUSCULAR
  Filled 2014-02-11: qty 2

## 2014-02-11 MED ORDER — HYDROCODONE-ACETAMINOPHEN 5-325 MG PO TABS: ORAL_TABLET | ORAL | Status: DC

## 2014-02-11 NOTE — ED Provider Notes (Signed)
CSN: [id number]     Arrival date & time 02/11/14  1827 History  This chart was scribed for non-physician practitioner George EmeryNicole Danaria Larsen, PA-C working with George MawKristen N Ward, DO by George Maiersaroline Early, ED Scribe. This patient was seen in room TR09C/TR09C and the patient's care was started at 6:41 PM.     Chief Complaint  Patient presents with  . Back Pain   The history is provided by the patient. No language interpreter was used.   HPI Comments: [name] is a 43 y.o. male with a h/o sciatica who presents to the Emergency Department complaining of an exacerbation of chronic aching lower back pain that started yesterday while walking around. He reports the severity of the pain as a 7/10 currently. He states prolonged sitting makes the pain worse. He reports he took some leftover Vicodin from his last flare-up of sciatica with some relief.    Past Medical History  Diagnosis Date  . Sciatic nerve pain    Past Surgical History  Procedure Laterality Date  . Appendectomy     History reviewed. No pertinent family history. History  Substance Use Topics  . Smoking status: Never Smoker   . Smokeless tobacco: Not on file  . Alcohol Use: No    Review of Systems  Constitutional: Negative for fever.  Musculoskeletal: Positive for back pain.  Neurological: Negative for numbness.  All other systems reviewed and are negative.     Allergies  Bee venom and Codeine  Home Medications   Prior to Admission medications   Medication Sig Start Date End Date Taking? Authorizing Provider  oxyCODONE-acetaminophen (PERCOCET/ROXICET) 5-325 MG per tablet Take 2 tablets by mouth every 4 (four) hours as needed for severe pain. 10/21/13  Yes George HelperBowie Tran, PA-C   BP 136/76  Pulse 78  Temp(Src) 98.5 F (36.9 C) (Oral)  Resp 18  SpO2 97% Physical Exam  Nursing note and vitals reviewed. Constitutional: He is oriented to person, place, and time. He appears well-developed and well-nourished. No distress.   HENT:  Head: Normocephalic.  Eyes: Conjunctivae and EOM are normal.  Cardiovascular: Normal rate.   Pulmonary/Chest: Effort normal. No stridor.  Musculoskeletal: Normal range of motion.  No point tenderness to percussion of lumbar spinal processes.  No TTP or paraspinal muscular spasm. Strength is 5 out of 5 to bilateral lower extremities at hip and knee; extensor hallucis longus 5 out of 5. Ankle strength 5 out of 5, no clonus, neurovascularly intact. No saddle anaesthesia. Patellar reflexes are 2+ bilaterally.        Neurological: He is alert and oriented to person, place, and time.  Psychiatric: He has a normal mood and affect.    ED Course  Procedures (including critical care time) Medications  ketorolac (TORADOL) injection 30 mg (30 mg Intramuscular Given 02/11/14 1952)    DIAGNOSTIC STUDIES: Oxygen Saturation is 97% on RA, normal by my interpretation.    COORDINATION OF CARE: 7:40 PM- Discussed treatment plan with pt which includes Toradol injection and discharge home with Vicodin. Pt agrees to plan.    Labs Review Labs Reviewed - No data to display  Imaging Review No results found.   EKG Interpretation None      MDM   Final diagnoses:  Lumbar radiculopathy    Filed Vitals:   02/11/14 1833 02/11/14 1949  BP: 136/76 118/73  Pulse: 78 78  Temp: 98.5 F (36.9 C) 98.7 F (37.1 C)  TempSrc: Oral   Resp: 18 17  SpO2: 97% 100%  Medications  ketorolac (TORADOL) injection 30 mg (30 mg Intramuscular Given 02/11/14 1952)    [name] is a 43 y.o. male presenting with  back pain.  No neurological deficits and normal neuro exam.  Patient can walk but states is painful.  No loss of bowel or bladder control.  No concern for cauda equina.  No fever, night sweats, weight loss, h/o cancer, IVDU.  RICE protocol and pain medicine indicated and discussed with patient.  Evaluation does not show pathology that would require ongoing emergent intervention or  inpatient treatment. Pt is hemodynamically stable and mentating appropriately. Discussed findings and plan with patient/guardian, who agrees with care plan. All questions answered. Return precautions discussed and outpatient follow up given.   Discharge Medication List as of 02/11/2014  7:44 PM    START taking these medications   Details  HYDROcodone-acetaminophen (NORCO/VICODIN) 5-325 MG per tablet Take 1-2 tablets by mouth every 6 hours as needed for pain., Print        Note: Portions of this report may have been transcribed using voice recognition software. Every effort was made to ensure accuracy; however, inadvertent computerized transcription errors may be present   I personally performed the services described in this documentation, which was scribed in my presence. The recorded information has been reviewed and is accurate.   George Emery, PA-C 02/12/14 [phone]

## 2014-02-11 NOTE — ED Notes (Signed)
Pt c/o of back pain; hx of sciatica. Reports pain started yesterday after walking around; denies heavy lifting.  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 Took Vicodin with relief last night.

## 2014-02-11 NOTE — Discharge Instructions (Signed)
STARTING TOMORROW: For pain control please take ibuprofen (also known as Motrin or Advil) 800mg  (this is normally 4 over the counter pills) 3 times a day  for 5 days. Take with food to minimize stomach irritation.   Take vicodin for breakthrough pain, do not drink alcohol, drive, care for children or do other critical tasks while taking vicodin.  Do not hesitate to return to the Emergency Department for any new, worsening or concerning symptoms.   If you do not have a primary care doctor you can establish one at the   [facility]: [address] [place] [address] [phone]  After you establish care. Let them know you were seen in the emergency room. They must obtain records for further management.    Lumbosacral Radiculopathy Lumbosacral radiculopathy is a pinched nerve or nerves in the low back (lumbosacral area). When this happens you may have weakness in your legs and may not be able to stand on your toes. You may have pain going down into your legs. There may be difficulties with walking normally. There are many causes of this problem. Sometimes this may happen from an injury, or simply from arthritis or boney problems. It may also be caused by other illnesses such as diabetes. If there is no improvement after treatment, further studies may be done to find the exact cause. DIAGNOSIS  X-rays may be needed if the problems become long standing. Electromyograms may be done. This study is one in which the working of nerves and muscles is studied. HOME CARE INSTRUCTIONS   Applications of ice packs may be helpful. Ice can be used in a plastic bag with a towel around it to prevent frostbite to skin. This may be used every 2 hours for 20 to 30 minutes, or as needed, while awake, or as directed by your caregiver.  Only take over-the-counter or prescription medicines for pain, discomfort, or fever as directed by your caregiver.  If physical therapy was prescribed, follow  your caregiver's directions. SEEK IMMEDIATE MEDICAL CARE IF:   You have pain not controlled with medications.  You seem to be getting worse rather than better.  You develop increasing weakness in your legs.  You develop loss of bowel or bladder control.  You have difficulty with walking or balance, or develop clumsiness in the use of your legs.  You have a fever. MAKE SURE YOU:   Understand these instructions.  Will watch your condition.  Will get help right away if you are not doing well or get worse. Document Released: 09/04/2005 Document Revised: 11/27/2011 Document Reviewed: 04/24/2008 [facility] Patient Information 2014 [place], [place].

## 2014-02-14 NOTE — ED Provider Notes (Signed)
Medical screening examination/treatment/procedure(s) were performed by non-physician practitioner and as supervising physician I was immediately available for consultation/collaboration.   EKG Interpretation None        Kristen N Ward, DO 02/14/14 0710 

## 2014-12-31 ENCOUNTER — Emergency Department (INDEPENDENT_AMBULATORY_CARE_PROVIDER_SITE_OTHER)
Admission: EM | Admit: 2014-12-31 | Discharge: 2014-12-31 | Disposition: A | Discharge status: Home / Self Care | Payer: Self-pay | Source: Home / Self Care | Attending: Family Medicine | Admitting: Family Medicine

## 2014-12-31 ENCOUNTER — Encounter (HOSPITAL_COMMUNITY): Payer: Self-pay | Admitting: Family Medicine

## 2014-12-31 DIAGNOSIS — R7309 Other abnormal glucose: Secondary | ICD-10-CM

## 2014-12-31 DIAGNOSIS — K645 Perianal venous thrombosis: Secondary | ICD-10-CM

## 2014-12-31 DIAGNOSIS — R7303 Prediabetes: Secondary | ICD-10-CM

## 2014-12-31 MED ORDER — HYDROCORTISONE 2.5 % RE CREA: 1.0000 "application " | TOPICAL_CREAM | Freq: Two times a day (BID) | RECTAL | Status: DC

## 2014-12-31 NOTE — ED Notes (Signed)
Reports three day flare up of hemorrhoids.   Having pain and swelling.   Mild relief with sitz baths.   Denies any bleeding.   Having pain with sitting.  Denies fever.  N/v/d.

## 2014-12-31 NOTE — ED Provider Notes (Signed)
CSN: [id number]     Arrival date & time 12/31/14  0930 History   First MD Initiated Contact with Patient 12/31/14 1005     Chief Complaint  Patient presents with  . Hemorrhoids   (Consider location/radiation/quality/duration/timing/severity/associated sxs/prior Treatment) HPI  Hemorrhoids: started 3-4 days ago. Painful near the anus. Hot salt baths w/ some improvement. BID BMs. Changed diet recently. Painful to sit and to pass BMs.  Chronic condition for pt. 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 Denies bloody bowel movements, fever, abdominal pain, dysuria, frequency, back pain, nausea, vomiting, diarrhea, constipation.   Past Medical History  Diagnosis Date  . Sciatic nerve pain    Past Surgical History  Procedure Laterality Date  . Appendectomy     Family History  Problem Relation Age of Onset  . Diabetes Mother   . Heart disease Father    History  Substance Use Topics  . Smoking status: Never Smoker   . Smokeless tobacco: Not on file  . Alcohol Use: No    Review of Systems Per HPI with all other pertinent systems negative.   Allergies  Bee venom and Codeine  Home Medications   Prior to Admission medications   Medication Sig Start Date End Date Taking? Authorizing Provider  hydrocortisone (ANUSOL-HC) 2.5 % rectal cream Place 1 application rectally 2 (two) times daily. 12/31/14   Ozella Rocksavid J Merrell, MD   BP 132/84 mmHg  Pulse 76  Temp(Src) 97.3 F (36.3 C) (Oral)  Resp 12  SpO2 97% Physical Exam Physical Exam  Constitutional: oriented to person, place, and time. appears well-developed and well-nourished. No distress.  HENT:  Head: Normocephalic and atraumatic.  Eyes: EOMI. PERRL.  Neck: Normal range of motion.  Cardiovascular: RRR, no m/r/g, 2+ distal pulses,  Pulmonary/Chest: Effort normal and breath sounds normal. No respiratory distress.  Abdominal: Soft.  Bowel sounds are normal. NonTTP, no distension.  large 2.5 x 1 cm thrombosed hemorrhoid, tenderness to palpation, no bleeding, no fluctuance, no erythema noted. Anal sphincter tone normal.  Musculoskeletal: Normal range of motion. Non ttp, no effusion.  Neurological: alert and oriented to person, place, and time.  Skin: Skin is warm. No rash noted. non diaphoretic.  Psychiatric: normal mood and affect. behavior is normal. Judgment and thought content normal.   ED Course  Procedures (including critical care time) Labs Review Labs Reviewed - No data to display  Imaging Review No results found.   MDM   1. Thrombosed hemorrhoids   2. Pre-diabetes    Thrombosed hemorrhoid beyond the treatment window for excision. Seems to be improving. Anusol cream for additional relief. Discussed fluid intake, fiber, aerobic exercise, use of a seat donut. Patient to follow-up if develops further hemorrhoids and is less than 48 hours for possible excision. Patient's recent A1c of 6.1 is in the prediabetic range. Discussed nutrition and exercise to help reduce risk of converting to full diabetes.    Ozella Rocksavid J Merrell, MD 12/31/14 1030

## 2014-12-31 NOTE — Discharge Instructions (Signed)
You have developed a thrombosed external hemorrhoid. Please continue the warm baths. Please start using the Anusol cream for relief. Remember to stay active drink plenty of fluids, and taking plenty of daily fiber. The goal is to have daily soft bowel movements. In the future if you develop one of these can be surgically removed if you see the physician in less than 48 hours. Please also start trying to get in 20 minutes of aerobic exercise 5 days a week. This will also help with preventing future hemorrhoids. Presented to the emergency room if you get worse.

## 2015-12-16 ENCOUNTER — Emergency Department (INDEPENDENT_AMBULATORY_CARE_PROVIDER_SITE_OTHER)
Admission: EM | Admit: 2015-12-16 | Discharge: 2015-12-16 | Disposition: A | Discharge status: Home / Self Care | Payer: Self-pay | Source: Home / Self Care | Attending: Family Medicine | Admitting: Family Medicine

## 2015-12-16 ENCOUNTER — Encounter (HOSPITAL_COMMUNITY): Payer: Self-pay

## 2015-12-16 DIAGNOSIS — R351 Nocturia: Secondary | ICD-10-CM

## 2015-12-16 DIAGNOSIS — R1084 Generalized abdominal pain: Secondary | ICD-10-CM

## 2015-12-16 LAB — POCT I-STAT, CHEM 8
BUN: 19 mg/dL (ref 6–20)
Calcium, Ion: 1.2 mmol/L (ref 1.12–1.23)
Chloride: 104 mmol/L (ref 101–111)
Creatinine, Ser: 1.2 mg/dL (ref 0.61–1.24)
Glucose, Bld: 91 mg/dL (ref 65–99)
HEMATOCRIT: 47 % (ref 39.0–52.0)
HEMOGLOBIN: 16 g/dL (ref 13.0–17.0)
Potassium: 3.8 mmol/L (ref 3.5–5.1)
SODIUM: 141 mmol/L (ref 135–145)
TCO2: 23 mmol/L (ref 0–100)

## 2015-12-16 LAB — POCT URINALYSIS DIP (DEVICE)
BILIRUBIN URINE: NEGATIVE
GLUCOSE, UA: NEGATIVE mg/dL
KETONES UR: NEGATIVE mg/dL
Leukocytes, UA: NEGATIVE
NITRITE: NEGATIVE
Protein, ur: NEGATIVE mg/dL
Specific Gravity, Urine: >=1.03 (ref 1.005–1.030)
Urobilinogen, UA: 0.2 mg/dL (ref 0.0–1.0)
pH: 6 (ref 5.0–8.0)

## 2015-12-16 NOTE — ED Notes (Signed)
Patient has been discharged by Marta LamasJames O. Mauricio PoBreen, MD

## 2015-12-16 NOTE — ED Notes (Signed)
Patient states he has had lower abdominal pain x4 days with a little pressure when he urinates, no color change and no odor to urine. No acute distress

## 2015-12-16 NOTE — ED Provider Notes (Addendum)
CSN: [id number]     Arrival date & time 12/16/15  1726 History   First MD Initiated Contact with Patient 12/16/15 1859     Chief Complaint  Patient presents with  . Abdominal Pain   (Consider location/radiation/quality/duration/timing/severity/associated sxs/prior Treatment) Patient is a 45 y.o. male presenting with abdominal pain. The history is provided by the patient. No language interpreter was used.  Abdominal Pain  Patient presents with complaint of generalized pressure-like sensation in abdomen. Had insurance physical and was told his A1C was pre-diabetes level at 6.1%, is concerned he might have DM. Mother with DM.  Reports slight increase in urinary frequency, has one episode of nocturia at 3-4am and then sleeps the rest of the night. No dysuria, no discharge, no frank hematuria.   Social Hx Never-smoker. No alcohol. Used to work in [employer], now self employed.   ROS No fevers or chills, no cough, no chest pain, no abd pain, no N/V/D.  No blood per rectum.  Past Medical History  Diagnosis Date  . Sciatic nerve pain    Past Surgical History  Procedure Laterality Date  . Appendectomy     Family History  Problem Relation Age of Onset  . Diabetes Mother   . Heart disease Father    Social History  Substance Use Topics  . Smoking status: Never Smoker   . Smokeless tobacco: None  . Alcohol Use: No    Review of Systems  Gastrointestinal: Positive for abdominal pain.    Allergies  Bee venom and Codeine  Home Medications   Prior to Admission medications   Medication Sig Start Date End Date Taking? Authorizing Provider  hydrocortisone (ANUSOL-HC) 2.5 % rectal cream Place 1 application rectally 2 (two) times daily. 12/31/14   Ozella Rocks, MD   Meds Ordered and Administered this Visit  Medications - No data to display  BP 125/85 mmHg  Pulse 80  Temp(Src) 99 F (37.2 C) (Oral)  Resp 16  SpO2 100% No data found.   Physical Exam  Constitutional: He appears  well-developed and well-nourished. No distress.  HENT:  Head: Normocephalic and atraumatic.  Mouth/Throat: Oropharynx is clear and moist. No oropharyngeal exudate.  Eyes: Conjunctivae and EOM are normal. Pupils are equal, round, and reactive to light. Right eye exhibits no discharge. Left eye exhibits no discharge. No scleral icterus.  Neck: Normal range of motion. Neck supple.  Cardiovascular: Normal rate, regular rhythm and normal heart sounds.   No murmur heard. Pulmonary/Chest: Effort normal and breath sounds normal. No respiratory distress. He has no wheezes. He has no rales. He exhibits no tenderness.  Abdominal: Soft. Bowel sounds are normal. He exhibits no distension and no mass. There is no tenderness. There is no rebound and no guarding.  No CVA tenderness  Lymphadenopathy:    He has no cervical adenopathy.  Skin: He is not diaphoretic.    ED Course  Procedures (including critical care time)  Labs Review Labs Reviewed  POCT URINALYSIS DIP (DEVICE) - Abnormal; Notable for the following:    Hgb urine dipstick TRACE (*)    All other components within normal limits    Imaging Review No results found.   Visual Acuity Review  Right Eye Distance:   Left Eye Distance:   Bilateral Distance:    Right Eye Near:   Left Eye Near:    Bilateral Near:         MDM   1. Generalized abdominal pressure   2. Nocturia  Patient with generalized vague complaint of abd fullness. He states his main concern is to rule out diabetes.  His urine dipstick is normal with the exception of trace Hgb. No glucosuria or protein.   Patient is encouraged to follow up with a primary care doctor for further evaluation and screening for DM.  To check serum glucose and renal function as well. Glucose 91, normal renal function.    Barbaraann BarthelJames O Zaryah Seckel, MD 12/16/15 [id number]  Barbaraann BarthelJames O Amahri Dengel, MD 12/16/15 [id number]

## 2015-12-16 NOTE — Discharge Instructions (Signed)
It is a pleasure to see you today.   As we discussed, your urine did not show any glucose or signs of infection. The dipstick reading indicates trace amount of Hemoglobin, which may mean there is a small amount of microscopic blood.  It is important to have this rechecked with a primary care doctor.  Your glucose is normal at 91.

## 2017-05-01 ENCOUNTER — Encounter (HOSPITAL_COMMUNITY): Payer: Self-pay | Admitting: *Deleted

## 2017-05-01 ENCOUNTER — Emergency Department (HOSPITAL_COMMUNITY)
Admission: EM | Admit: 2017-05-01 | Discharge: 2017-05-01 | Disposition: A | Discharge status: Home / Self Care | Payer: Self-pay | Attending: Emergency Medicine | Admitting: Emergency Medicine

## 2017-05-01 DIAGNOSIS — L03114 Cellulitis of left upper limb: Secondary | ICD-10-CM | POA: Insufficient documentation

## 2017-05-01 HISTORY — DX: Rheumatoid arthritis, unspecified: M06.9

## 2017-05-01 MED ORDER — SULFAMETHOXAZOLE-TRIMETHOPRIM 800-160 MG PO TABS: 1.0000 | ORAL_TABLET | Freq: Two times a day (BID) | ORAL | 0 refills | Status: AC

## 2017-05-01 MED ORDER — LIDOCAINE HCL (PF) 1 % IJ SOLN
2.0000 mL | Freq: Once | INTRAMUSCULAR | Status: AC
  Administered 2017-05-01: 2 mL
  Filled 2017-05-01: qty 5

## 2017-05-01 NOTE — Discharge Instructions (Signed)
As discussed, take entire course of antibiotics even if you feel better. Stay well-hydrated. Monitor for any worsening. Have the wound rechecked in 48 hours.  Return sooner if the area of redness gets bigger, pain increases, swelling increases, fever, chills or other new concerning symptoms.

## 2017-05-01 NOTE — ED Triage Notes (Signed)
Pt noticed a red swollen area to L forearm onset last night, denies fever or chills. Heat pack in place

## 2017-05-01 NOTE — ED Provider Notes (Signed)
[facility]-EMERGENCY DEPT Provider Note   CSN: [id number] Arrival date & time: 05/01/17  1919  By signing my name below, I, Diona Browner, attest that this documentation has been prepared under the direction and in the presence of Mathews Robinsons, PA-C. Electronically Signed: Diona Browner, ED Scribe. 05/01/17. 9:43 PM.  History   Chief Complaint Chief Complaint  Patient presents with  . Abscess    HPI [name] is a 46 y.o. male who presents to the Emergency Department complaining of a gradually worsening, throbbing, 6/10, red swollen area to left forearm that was noticed last night. Pt reports he was sleeping on the ground when he thinks something bit him. Notes when he hit the area it shoots pain down his arm. Heat mildly alleviates sx. He notes having an abscess in his mouth in the past. No hx of DM or HTN. No daily medications. Allergic to codeine. Pt denies fever, chills, nausea, vomiting, drainage, numbness, tingling, or any other complaints at this time.   The history is provided by the patient and a relative. No language interpreter was used.    Past Medical History:  Diagnosis Date  . Rheumatoid arthritis (HCC)   . Sciatic nerve pain     There are no active problems to display for this patient.   Past Surgical History:  Procedure Laterality Date  . APPENDECTOMY         Home Medications    Prior to Admission medications   Medication Sig Start Date End Date Taking? Authorizing Provider  hydrocortisone (ANUSOL-HC) 2.5 % rectal cream Place 1 application rectally 2 (two) times daily. 12/31/14   Ozella Rocks, MD  sulfamethoxazole-trimethoprim (BACTRIM DS,SEPTRA DS) 800-160 MG tablet Take 1 tablet by mouth 2 (two) times daily. 05/01/17 05/08/17  Georgiana Shore, PA-C    Family History Family History  Problem Relation Age of Onset  . Diabetes Mother   . Heart disease Father     Social History Social History  Substance Use Topics  . Smoking status:  Never Smoker  . Smokeless tobacco: Never Used  . Alcohol use No     Allergies   Bee venom and Codeine   Review of Systems Review of Systems  Constitutional: Negative for chills and fever.  Gastrointestinal: Negative for nausea and vomiting.  Skin: Positive for color change and wound.  Neurological: Negative for numbness.     Physical Exam Updated Vital Signs BP (!) 139/96 (BP Location: Right Arm)   Pulse 71   Temp 98.1 F (36.7 C) (Oral)   Resp 16   SpO2 98%   Physical Exam  Constitutional: He appears well-developed and well-nourished. No distress.  Patient is afebrile, non-toxic appearing, sitting comfortably in chair in no acute distress.  HENT:  Head: Normocephalic and atraumatic.  Eyes: Conjunctivae and EOM are normal.  Neck: Normal range of motion.  Cardiovascular: Normal rate, regular rhythm, normal heart sounds and intact distal pulses.   Pulmonary/Chest: Effort normal and breath sounds normal. No respiratory distress. He has no wheezes.  Abdominal: He exhibits no distension.  Musculoskeletal: Normal range of motion. He exhibits edema and tenderness. He exhibits no deformity.  Posterior left forearm distal to the elbow, 4 cm by 4 cm area of erythema and induration with warmth to the touch. Central area for fluctuance is 2 mm.  Neurological: He is alert. No sensory deficit.  Skin: Skin is warm and dry. No rash noted. He is not diaphoretic. There is erythema. No pallor.  Psychiatric: He  has a normal mood and affect. His behavior is normal.  Nursing note and vitals reviewed.    ED Treatments / Results  DIAGNOSTIC STUDIES: Oxygen Saturation is 99% on RA, normal by my interpretation.   COORDINATION OF CARE: 9:43 PM-Discussed next steps with pt. Pt verbalized understanding and is agreeable with the plan.   Labs (all labs ordered are listed, but only abnormal results are displayed) Labs Reviewed - No data to display  EKG  EKG Interpretation None        Radiology No results found.  Procedures Procedures (including critical care time) EMERGENCY DEPARTMENT US SOFT TISSUE INTERPRETATION "Study: Limited Soft Tissue Ultrasound"  INDICATIONS: Soft tissue infection Multiple views of the body part were obtained in real-time with a multi-frequency linear probe  PERFORMED BY: Myself IMAGES ARCHIVED?: Yes SIDE:Left BODY PART:Upper extremity INTERPRETATION:  No abcess noted and Cellulitis present  INCISION AND DRAINAGE Performed by: PA-student supervised by Georgiana ShoreJessica B Tyaira Heward Consent: Verbal consent obtained. Risks and benefits: risks, benefits and alternatives were discussed Type: abscess  Body area: left arm  Anesthesia: local infiltration  Incision was made with 18 gauge needle  Local anesthetic: lidocaine 1% w/out epinephrine  Anesthetic total: 2 ml  Complexity: simple no dissection to break up loculations  Drainage: purulent  Drainage amount: 0.5 ml  Packing material: n/a  Patient tolerance: Patient tolerated the procedure well with no immediate complications.   Medications Ordered in ED Medications  lidocaine (PF) (XYLOCAINE) 1 % injection 2 mL (2 mLs Infiltration Given 05/01/17 2212)     Initial Impression / Assessment and Plan / ED Course  I have reviewed the triage vital signs and the nursing notes.  Pertinent labs & imaging results that were available during my care of the patient were reviewed by me and considered in my medical decision making (see chart for details).     Patient with cellulitis and beginning to form superficial skin abscess. Small area of fluctuance and purulence was incised with an 18-gauge needle.  Patient expressed relief after incision. Small amount of purulent discharge. No obvious abscess on ultrasound but surrounding cellulitis noted. The area was marked for monitoring.  Encourage wound recheck in 48 hours. Discharge home with close follow-up and antibiotic therapy. Patient  is otherwise well-appearing, afebrile nontoxic reported improvement while in ED.  Discussed strict return precautions and advised to return to the emergency department if experiencing any new or worsening symptoms. Instructions were understood and patient agreed with discharge plan.  Final Clinical Impressions(s) / ED Diagnoses   Final diagnoses:  Cellulitis of left upper extremity    New Prescriptions Discharge Medication List as of 05/01/2017 11:09 PM    START taking these medications   Details  sulfamethoxazole-trimethoprim (BACTRIM DS,SEPTRA DS) 800-160 MG tablet Take 1 tablet by mouth 2 (two) times daily., Starting Tue 05/01/2017, Until Tue 05/08/2017, Print       I personally performed the services described in this documentation, which was scribed in my presence. The recorded information has been reviewed and is accurate.     Georgiana ShoreMitchell, Antwone Capozzoli B, PA-C 05/02/17 0127    Marily MemosMesner, Jason, MD 05/04/17 [phone]

## 2022-10-25 ENCOUNTER — Ambulatory Visit (HOSPITAL_COMMUNITY)
Admission: EM | Admit: 2022-10-25 | Discharge: 2022-10-25 | Disposition: A | Discharge status: Home / Self Care | Payer: No Typology Code available for payment source | Attending: Internal Medicine | Admitting: Internal Medicine

## 2022-10-25 ENCOUNTER — Encounter (HOSPITAL_COMMUNITY): Payer: Self-pay

## 2022-10-25 ENCOUNTER — Ambulatory Visit (INDEPENDENT_AMBULATORY_CARE_PROVIDER_SITE_OTHER): Payer: No Typology Code available for payment source

## 2022-10-25 DIAGNOSIS — M25562 Pain in left knee: Secondary | ICD-10-CM

## 2022-10-25 NOTE — ED Provider Notes (Signed)
[place]   [id number] 10/25/22 Arrival Time: 1007  ASSESSMENT & PLAN:  3 views of left knee were negative for fracture.  I do appreciate some mild medial compartment and patellofemoral compartment narrowing.  1. Acute pain of left knee    -Patient's exam and imaging are reassuring today.  He has no red flag signs of infection from stepping on a nail 6 weeks ago.  He is already got his tetanus booster.  I do think that some of his knee pain is due to some mild arthritis as seen in the mild joint space narrowing in his medial compartment.  I recommended over-the-counter anti-inflammatory such as ibuprofen along with regular exercise and weight loss.  He can wear brace for comfort.  All his questions were answered and he agreed to the plan.  No orders of the defined types were placed in this encounter.  Discharge Instructions   None       Reviewed expectations re: course of current medical issues. Questions answered. Outlined signs and symptoms indicating need for more acute intervention. Patient verbalized understanding. After Visit Summary given.   SUBJECTIVE: Incredibly pleasant 52 year old male past medical history of rheumatoid arthritis who comes to urgent care to be evaluated for left knee pain.  Symptoms began about a week ago.  He reports a dull ache in the posterior aspect of the knee.  It is made worse with bending and extending the knee.  It hurts to walk on it.  He denies any calf pain.  Denies any long car rides or plane rides.  Of note, about 6 weeks ago he stepped on a nail with his left foot while at work.  He got a tetanus shot 2 days later.  He wanted to make sure he is not having any signs or symptoms of infection in the knee or leg or foot due to this.  Denies any fevers, chills, chest pain, shortness of breath.  No LMP for male patient. Past Surgical History:  Procedure Laterality Date   APPENDECTOMY       OBJECTIVE:  Vitals:   10/25/22  1030  BP: (!) 153/112  Pulse: 80  Resp: 18  Temp: 98.1 F (36.7 C)  TempSrc: Oral  SpO2: 95%     Physical Exam Vitals reviewed.  Constitutional:      Appearance: Normal appearance. He is not ill-appearing.  HENT:     Head: Normocephalic.  Cardiovascular:     Rate and Rhythm: Normal rate.  Pulmonary:     Effort: Pulmonary effort is normal.  Musculoskeletal:     Comments: Left knee -no obvious effusion.  No erythema or ecchymoses.  Nontender to palpation of the medial lateral joint line.  Mildly tender to palpation in the popliteal fossa.  Full range of motion in flexion and extension.  5/5 strength throughout.  No ligamentous instability with valgus or varus stressing.  Firm endpoint on his Lachman exam.  Mildly positive Thessaly test.  Skin:    Comments: Left foot -no obvious deformity.  There is a small hyperpigmented scar at the plantar aspect of the foot near the heel.  There is no erythema or warmth there.  No discharge.  Neurological:     Mental Status: He is alert.      Labs: Results for orders placed or performed during the hospital encounter of 12/16/15  POCT urinalysis dip (device)  Result Value Ref Range   Glucose, UA NEGATIVE NEGATIVE mg/dL   Bilirubin Urine NEGATIVE NEGATIVE  Ketones, ur NEGATIVE NEGATIVE mg/dL   Specific Gravity, Urine >=1.030 1.005 - 1.030   Hgb urine dipstick TRACE (A) NEGATIVE   pH 6.0 5.0 - 8.0   Protein, ur NEGATIVE NEGATIVE mg/dL   Urobilinogen, UA 0.2 0.0 - 1.0 mg/dL   Nitrite NEGATIVE NEGATIVE   Leukocytes, UA NEGATIVE NEGATIVE  I-STAT, chem 8  Result Value Ref Range   Sodium 141 135 - 145 mmol/L   Potassium 3.8 3.5 - 5.1 mmol/L   Chloride 104 101 - 111 mmol/L   BUN 19 6 - 20 mg/dL   Creatinine, Ser 1.20 0.61 - 1.24 mg/dL   Glucose, Bld 91 65 - 99 mg/dL   Calcium, Ion 1.20 1.12 - 1.23 mmol/L   TCO2 23 0 - 100 mmol/L   Hemoglobin 16.0 13.0 - 17.0 g/dL   HCT 47.0 39.0 - 52.0 %   Labs Reviewed - No data to  display  Imaging: DG Knee AP/LAT W/Sunrise Left  Result Date: 10/25/2022 CLINICAL DATA:  Left knee pain. EXAM: LEFT KNEE 3 VIEWS COMPARISON:  None Available. FINDINGS: No evidence of fracture, dislocation, or joint effusion. No evidence of arthropathy or other focal bone abnormality. Soft tissues are unremarkable. IMPRESSION: Negative. Electronically Signed   By: Aletta Edouard M.D.   On: 10/25/2022 11:02     Allergies  Allergen Reactions   Bee Venom    Codeine                                                Past Medical History:  Diagnosis Date   Rheumatoid arthritis ([place])    Sciatic nerve pain     Social History   Socioeconomic History   Marital status: Legally Separated    Spouse name: Not on file   Number of children: Not on file   Years of education: Not on file   Highest education level: Not on file  Occupational History   Not on file  Tobacco Use   Smoking status: Never   Smokeless tobacco: Never  Substance and Sexual Activity   Alcohol use: No   Drug use: No   Sexual activity: Yes    Birth control/protection: Condom  Other Topics Concern   Not on file  Social History Narrative   Not on file   Social Determinants of Health   Financial Resource Strain: Not on file  Food Insecurity: Not on file  Transportation Needs: Not on file  Physical Activity: Not on file  Stress: Not on file  Social Connections: Not on file  Intimate Partner Violence: Not on file    Family History  Problem Relation Age of Onset   Diabetes Mother    Heart disease Father       Jazminn Pomales, Dorian Pod, MD 10/25/22 1131

## 2022-10-25 NOTE — ED Triage Notes (Signed)
Pt c/o pain behind lt knee for over a week. Denies long car rides or planes. States step on a nail with lt foot 6 wks weeks ago.
